# Patient Record
Sex: Female | Born: 1940 | Race: White | Hispanic: No | Smoking: Never smoker
Health system: Southern US, Community
[De-identification: ages and names within clinical notes are randomized; demographics above are authoritative.]

## PROBLEM LIST (undated history)

## (undated) DIAGNOSIS — Z9849 Cataract extraction status, unspecified eye: Secondary | ICD-10-CM

## (undated) DIAGNOSIS — G219 Secondary parkinsonism, unspecified: Secondary | ICD-10-CM

## (undated) DIAGNOSIS — M545 Low back pain, unspecified: Secondary | ICD-10-CM

## (undated) DIAGNOSIS — I10 Essential (primary) hypertension: Secondary | ICD-10-CM

## (undated) DIAGNOSIS — E039 Hypothyroidism, unspecified: Secondary | ICD-10-CM

## (undated) DIAGNOSIS — Z87440 Personal history of urinary (tract) infections: Secondary | ICD-10-CM

## (undated) DIAGNOSIS — G8929 Other chronic pain: Secondary | ICD-10-CM

## (undated) DIAGNOSIS — F410 Panic disorder [episodic paroxysmal anxiety] without agoraphobia: Secondary | ICD-10-CM

## (undated) DIAGNOSIS — E785 Hyperlipidemia, unspecified: Secondary | ICD-10-CM

## (undated) DIAGNOSIS — Z9089 Acquired absence of other organs: Secondary | ICD-10-CM

## (undated) DIAGNOSIS — Z9889 Other specified postprocedural states: Secondary | ICD-10-CM

## (undated) DIAGNOSIS — G2571 Drug induced akathisia: Secondary | ICD-10-CM

## (undated) DIAGNOSIS — R413 Other amnesia: Secondary | ICD-10-CM

## (undated) DIAGNOSIS — N189 Chronic kidney disease, unspecified: Secondary | ICD-10-CM

## (undated) DIAGNOSIS — IMO0002 Reserved for concepts with insufficient information to code with codable children: Secondary | ICD-10-CM

## (undated) DIAGNOSIS — F319 Bipolar disorder, unspecified: Secondary | ICD-10-CM

## (undated) DIAGNOSIS — R269 Unspecified abnormalities of gait and mobility: Secondary | ICD-10-CM

## (undated) HISTORY — DX: Hypothyroidism, unspecified: E03.9

## (undated) HISTORY — DX: Other chronic pain: G89.29

## (undated) HISTORY — DX: Panic disorder (episodic paroxysmal anxiety): F41.0

## (undated) HISTORY — DX: Other amnesia: R41.3

## (undated) HISTORY — DX: Chronic kidney disease, unspecified: N18.9

## (undated) HISTORY — PX: OTHER SURGICAL HISTORY: SHX169

## (undated) HISTORY — DX: Bipolar disorder, unspecified: F31.9

## (undated) HISTORY — PX: BUNIONECTOMY: SHX129

## (undated) HISTORY — DX: Secondary parkinsonism, unspecified: G21.9

## (undated) HISTORY — DX: Acquired absence of other organs: Z90.89

## (undated) HISTORY — PX: CATARACT EXTRACTION: SUR2

## (undated) HISTORY — DX: Reserved for concepts with insufficient information to code with codable children: IMO0002

## (undated) HISTORY — DX: Hyperlipidemia, unspecified: E78.5

## (undated) HISTORY — DX: Essential (primary) hypertension: I10

## (undated) HISTORY — DX: Personal history of urinary (tract) infections: Z87.440

## (undated) HISTORY — DX: Cataract extraction status, unspecified eye: Z98.49

## (undated) HISTORY — DX: Drug induced akathisia: G25.71

## (undated) HISTORY — DX: Low back pain, unspecified: M54.50

## (undated) HISTORY — PX: OOPHORECTOMY: SHX86

## (undated) HISTORY — DX: Unspecified abnormalities of gait and mobility: R26.9

## (undated) HISTORY — DX: Low back pain: M54.5

## (undated) HISTORY — PX: TONSILLECTOMY: SUR1361

## (undated) HISTORY — PX: CHOLECYSTECTOMY: SHX55

## (undated) HISTORY — DX: Other specified postprocedural states: Z98.890

---

## 2009-05-11 ENCOUNTER — Inpatient Hospital Stay (HOSPITAL_COMMUNITY): Admission: RE | Admit: 2009-05-11 | Discharge: 2009-05-15 | Payer: Self-pay | Admitting: Neurological Surgery

## 2009-06-13 ENCOUNTER — Encounter: Admission: RE | Admit: 2009-06-13 | Discharge: 2009-06-13 | Payer: Self-pay | Admitting: Neurological Surgery

## 2009-07-18 ENCOUNTER — Encounter: Admission: RE | Admit: 2009-07-18 | Discharge: 2009-07-18 | Payer: Self-pay | Admitting: Neurological Surgery

## 2009-07-19 ENCOUNTER — Encounter: Admission: RE | Admit: 2009-07-19 | Discharge: 2009-07-19 | Payer: Self-pay | Admitting: Neurological Surgery

## 2009-08-29 ENCOUNTER — Encounter: Admission: RE | Admit: 2009-08-29 | Discharge: 2009-08-29 | Payer: Self-pay | Admitting: Neurological Surgery

## 2009-08-30 ENCOUNTER — Encounter: Admission: RE | Admit: 2009-08-30 | Discharge: 2009-08-30 | Payer: Self-pay | Admitting: Neurological Surgery

## 2009-09-01 ENCOUNTER — Encounter: Admission: RE | Admit: 2009-09-01 | Discharge: 2009-11-30 | Payer: Self-pay | Admitting: Neurological Surgery

## 2009-10-28 ENCOUNTER — Encounter: Admission: RE | Admit: 2009-10-28 | Discharge: 2009-10-28 | Payer: Self-pay | Admitting: Neurology

## 2009-12-02 ENCOUNTER — Encounter: Admission: RE | Admit: 2009-12-02 | Discharge: 2009-12-02 | Payer: Self-pay | Admitting: Neurological Surgery

## 2010-03-20 ENCOUNTER — Encounter: Admission: RE | Admit: 2010-03-20 | Discharge: 2010-03-20 | Payer: Self-pay | Admitting: Neurological Surgery

## 2010-09-02 ENCOUNTER — Encounter: Payer: Self-pay | Admitting: Neurological Surgery

## 2010-11-16 LAB — APTT: aPTT: 28 seconds (ref 24–37)

## 2010-11-16 LAB — GLUCOSE, CAPILLARY

## 2010-11-16 LAB — BASIC METABOLIC PANEL
Chloride: 100 mEq/L (ref 96–112)
Creatinine, Ser: 1.52 mg/dL — ABNORMAL HIGH (ref 0.4–1.2)
Potassium: 4.3 mEq/L (ref 3.5–5.1)
Sodium: 137 mEq/L (ref 135–145)

## 2010-11-16 LAB — DIFFERENTIAL
Basophils Absolute: 0 10*3/uL (ref 0.0–0.1)
Basophils Relative: 0 % (ref 0–1)
Eosinophils Absolute: 0.1 10*3/uL (ref 0.0–0.7)
Lymphocytes Relative: 10 % — ABNORMAL LOW (ref 12–46)
Lymphs Abs: 1.1 10*3/uL (ref 0.7–4.0)
Monocytes Absolute: 0.4 10*3/uL (ref 0.1–1.0)
Monocytes Relative: 4 % (ref 3–12)
Neutro Abs: 9.3 10*3/uL — ABNORMAL HIGH (ref 1.7–7.7)

## 2010-11-16 LAB — NO BLOOD PRODUCTS

## 2010-11-16 LAB — PROTIME-INR: Prothrombin Time: 12.6 seconds (ref 11.6–15.2)

## 2010-11-16 LAB — CBC
HCT: 35.7 % — ABNORMAL LOW (ref 36.0–46.0)
MCV: 93.6 fL (ref 78.0–100.0)
Platelets: 312 10*3/uL (ref 150–400)
RBC: 3.82 MIL/uL — ABNORMAL LOW (ref 3.87–5.11)
WBC: 11 10*3/uL — ABNORMAL HIGH (ref 4.0–10.5)

## 2011-03-18 IMAGING — RF DG LUMBAR SPINE 2-3V
1 series · 2 of 2 positions shown · non-contrast
Comparison: None

CLINICAL DATA: Posterior fusion.

LUMBAR SPINE - 2-3 VIEW

[Series 1: run · 2 of 2 slices shown]
[im 1/2]
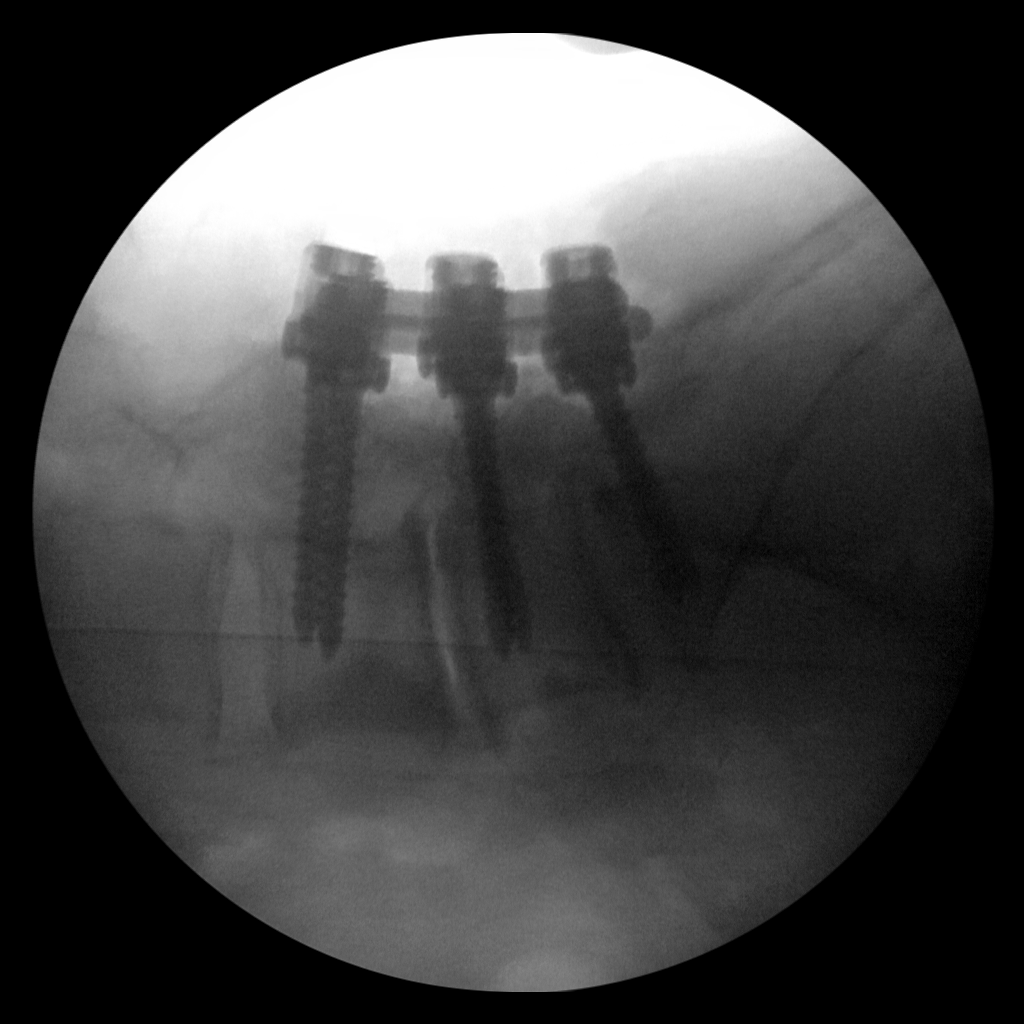
[im 2/2]
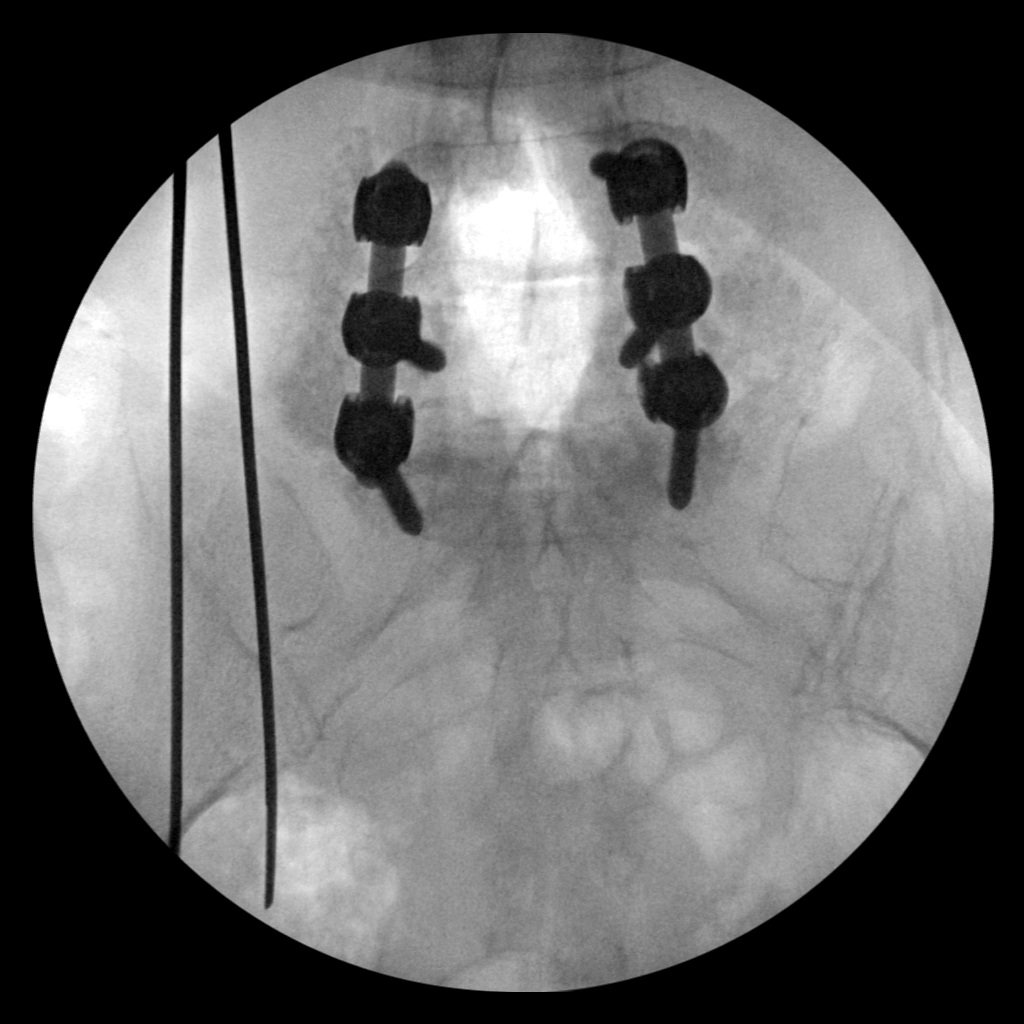

[2 of 2 positions shown; findings below may reference images not displayed]

FINDINGS: Two intraoperative spot images demonstrate changes of
posterior fusion from L4-S1.  Normal alignment.
IMPRESSION: L4-S1 posterior fusion.

## 2011-07-06 IMAGING — CR DG LUMBAR SPINE COMPLETE 4+V
4 series · 4 of 4 positions shown · non-contrast
Comparison: [HOSPITAL] at [REDACTED] [HOSPITAL] lumbar spine
radiographs 07/18/2009 and [HOSPITAL] [HOSPITAL] lumbar
spine CT 07/19/2009.

CLINICAL DATA: LUMBAR SPINE - COMPLETE 4+ VIEW

[w l-spine lat (1 of 3)]
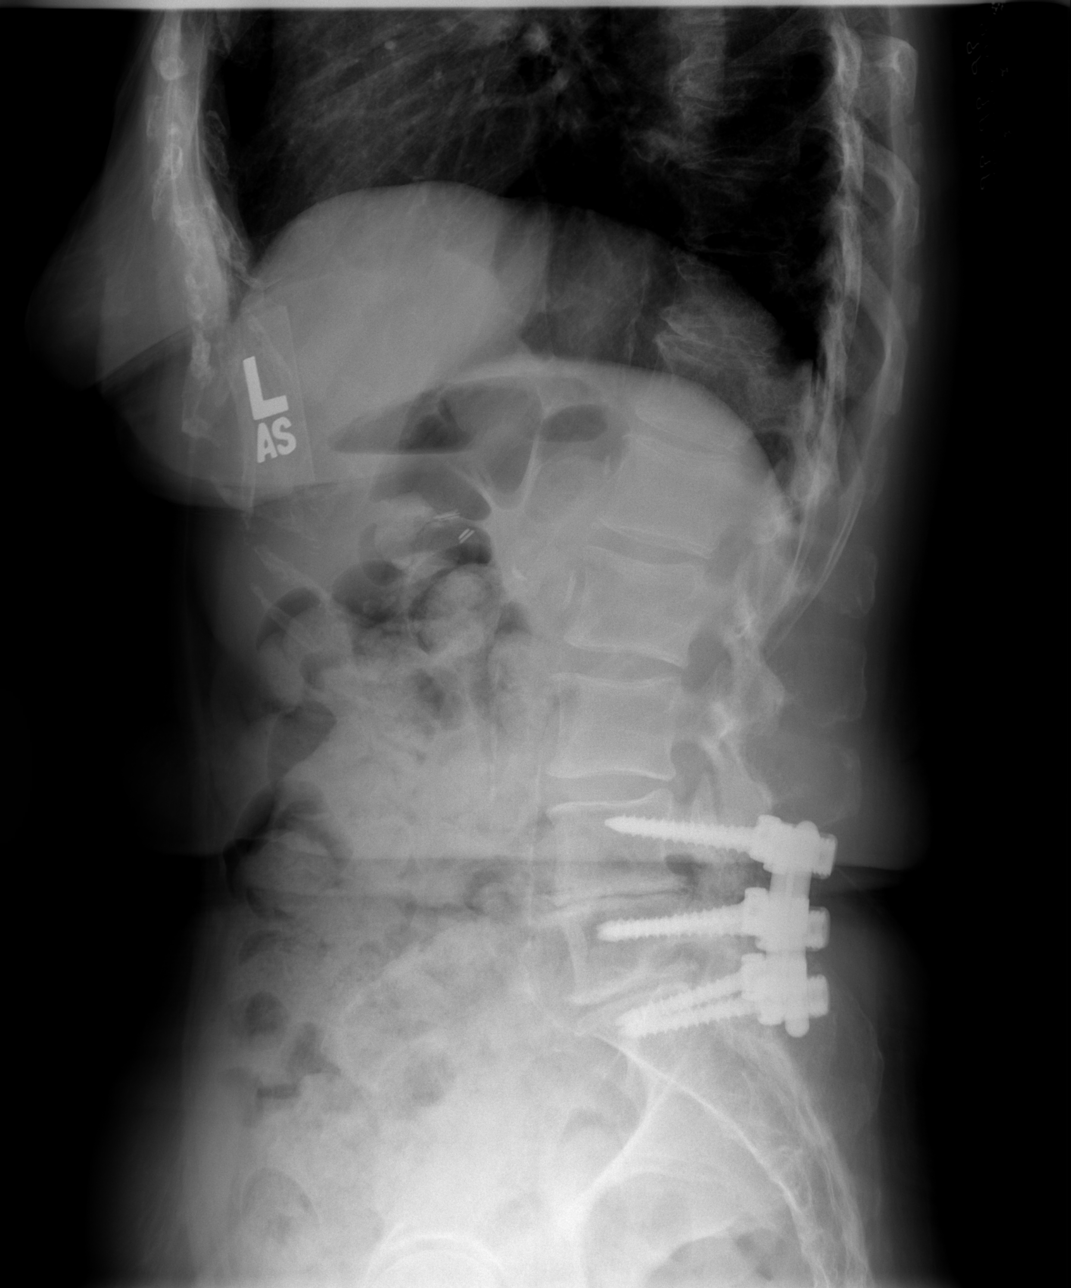

[w l-spine lat (2 of 3)]
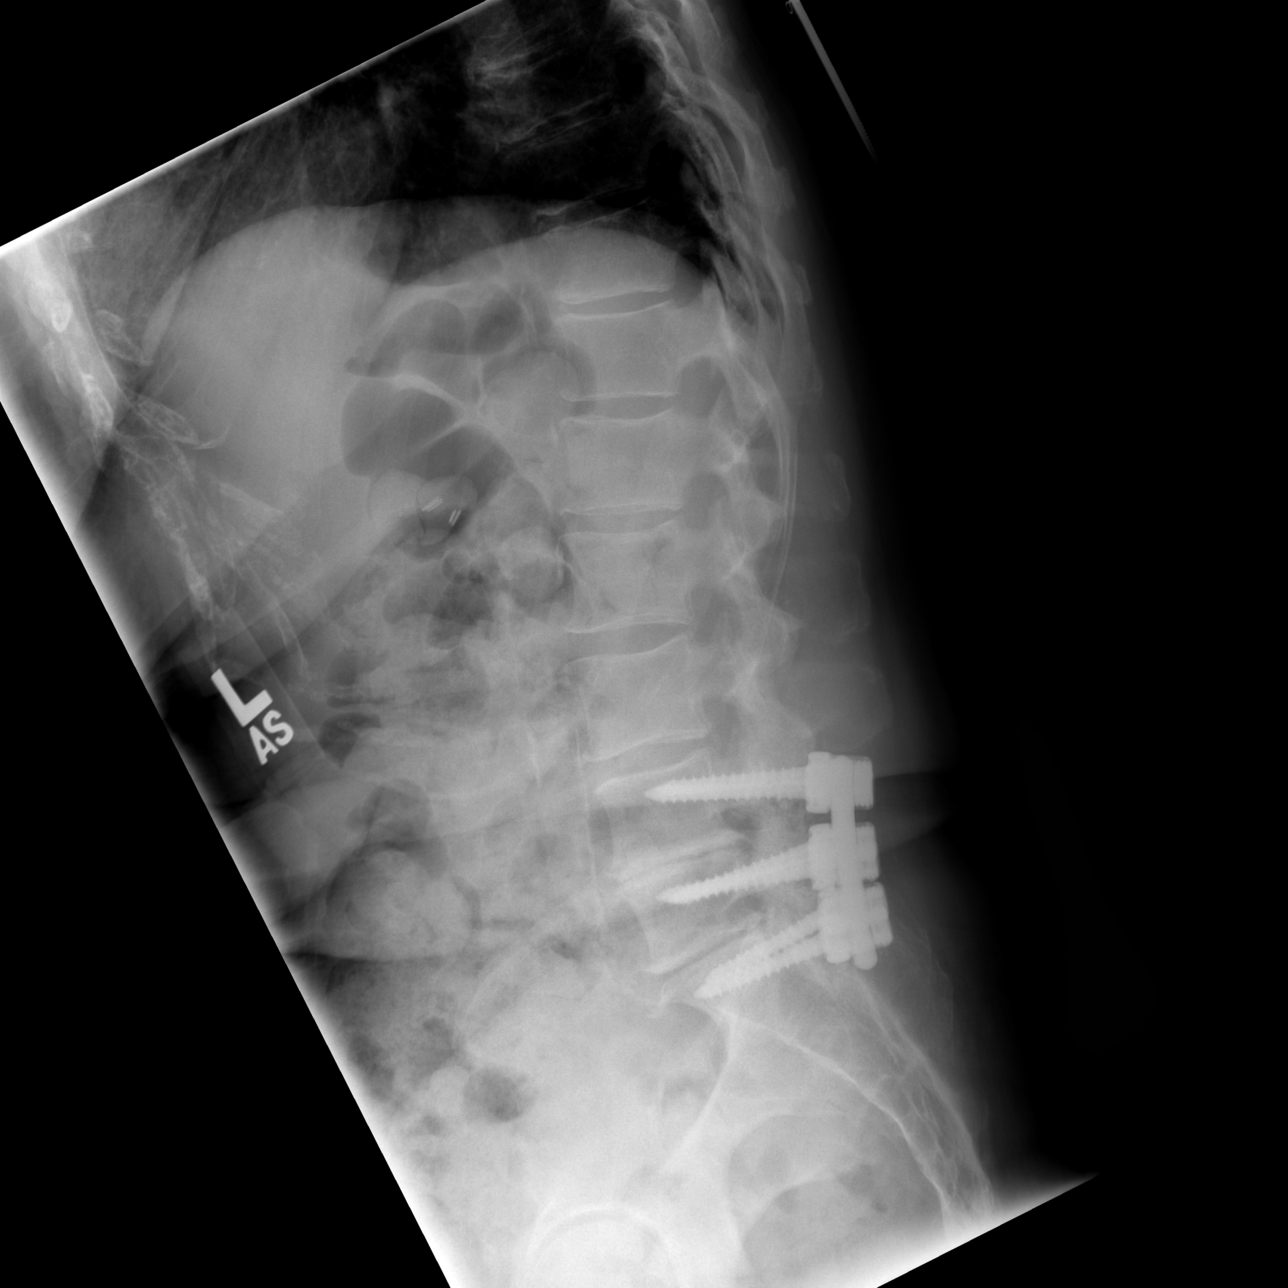

[w l-spine lat (3 of 3)]
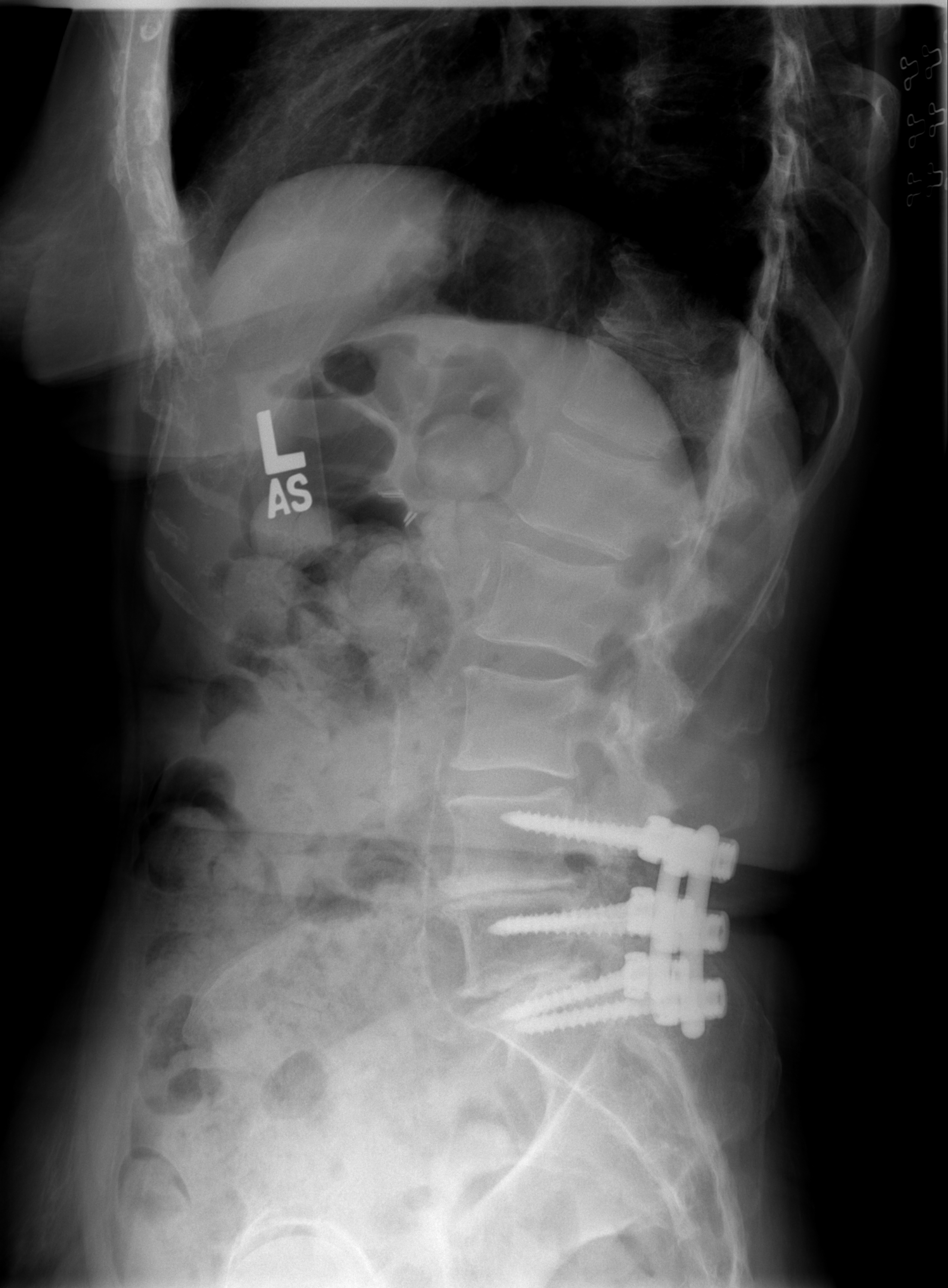

[w l-spine a.p.]
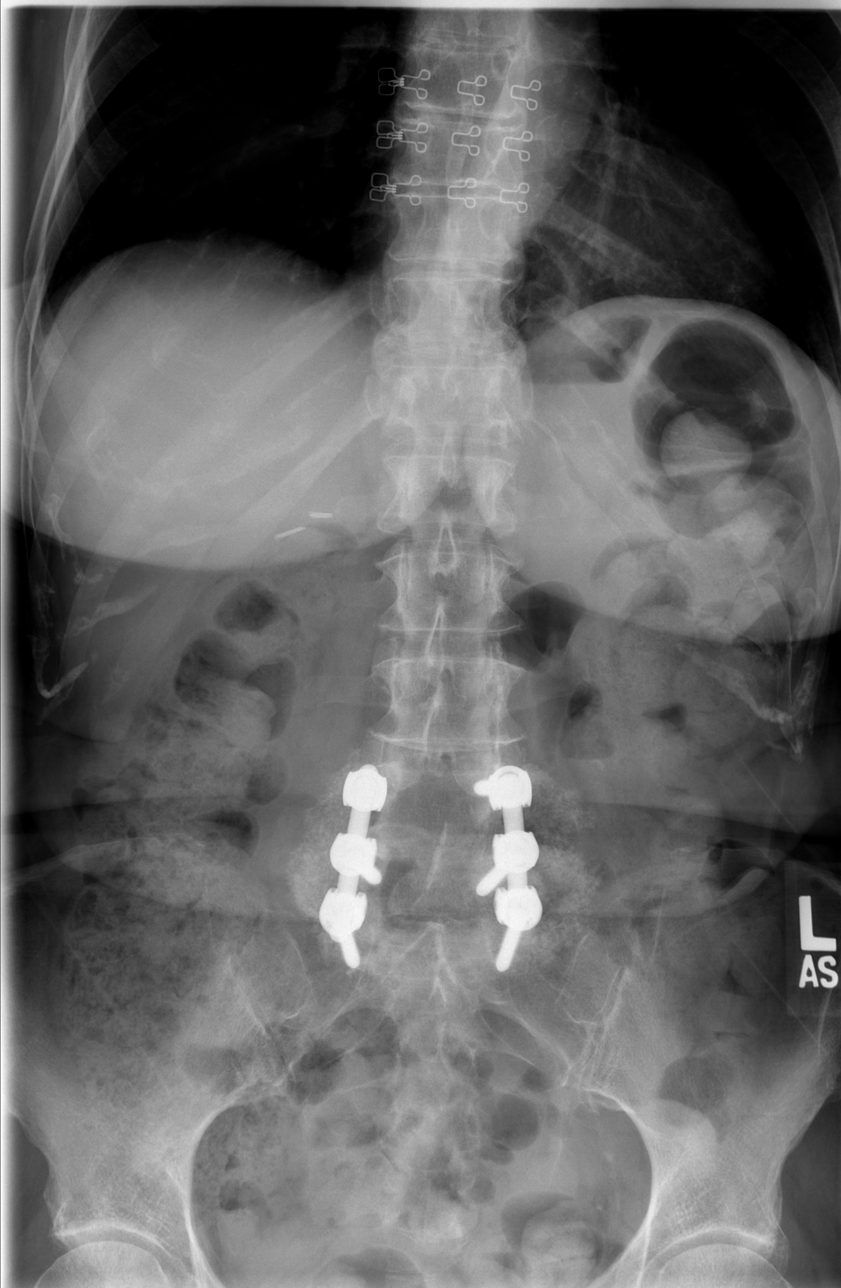

[4 of 4 positions shown; findings below may reference images not displayed]

FINDINGS: Again seen five non-rib bearing lumbar vertebrae with
stable posterior laminectomy hardware and osseous fusion L4-S1.
Stable advanced degenerative disc disease L4-5 and L5-S1 noted.
The posterior vertebral alignment is normally maintained with
stable slight posterior spondylosis L4-5 and L5-S1 on neutral and
flexion/extension lateral views.  No change in extensive
atheromatous vascular calcification with normal caliber abdominal
aorta.
IMPRESSION: 1.  No interval acute findings.
2.  Normal posterior vertebral alignment on the lateral neutral and
flexion/extension views.
3.  Stable satisfactory posterior laminectomy fusion L4-S1 with
advanced degenerative disc disease L4-5 and L5-S1 with slight
posterior spondylosis at these levels.

## 2011-07-07 IMAGING — CR DG PELVIS 1-2V
2 series · 2 of 2 positions shown · non-contrast
Comparison: Bilateral hip films 07/18/2009.

CLINICAL DATA: Pelvic pain.

PELVIS - 1-2 VIEW

[view not recorded (1 of 2)]
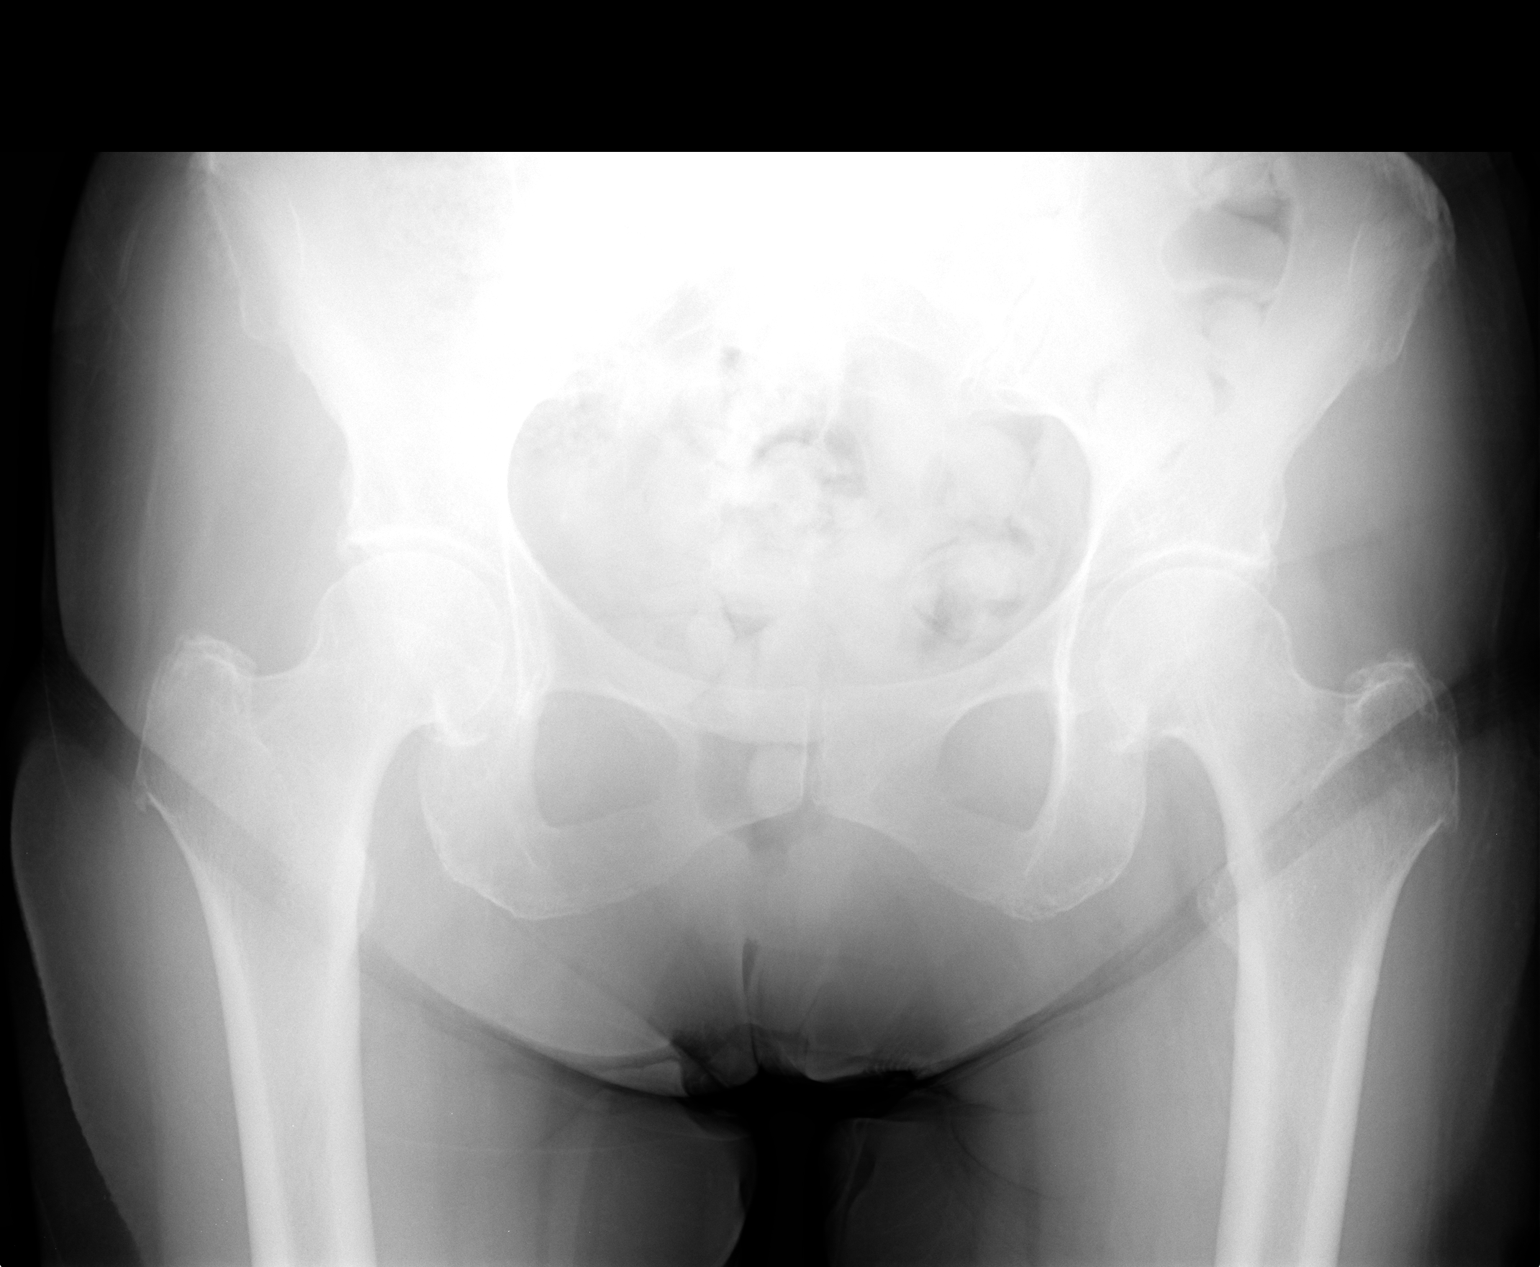

[view not recorded (2 of 2)]
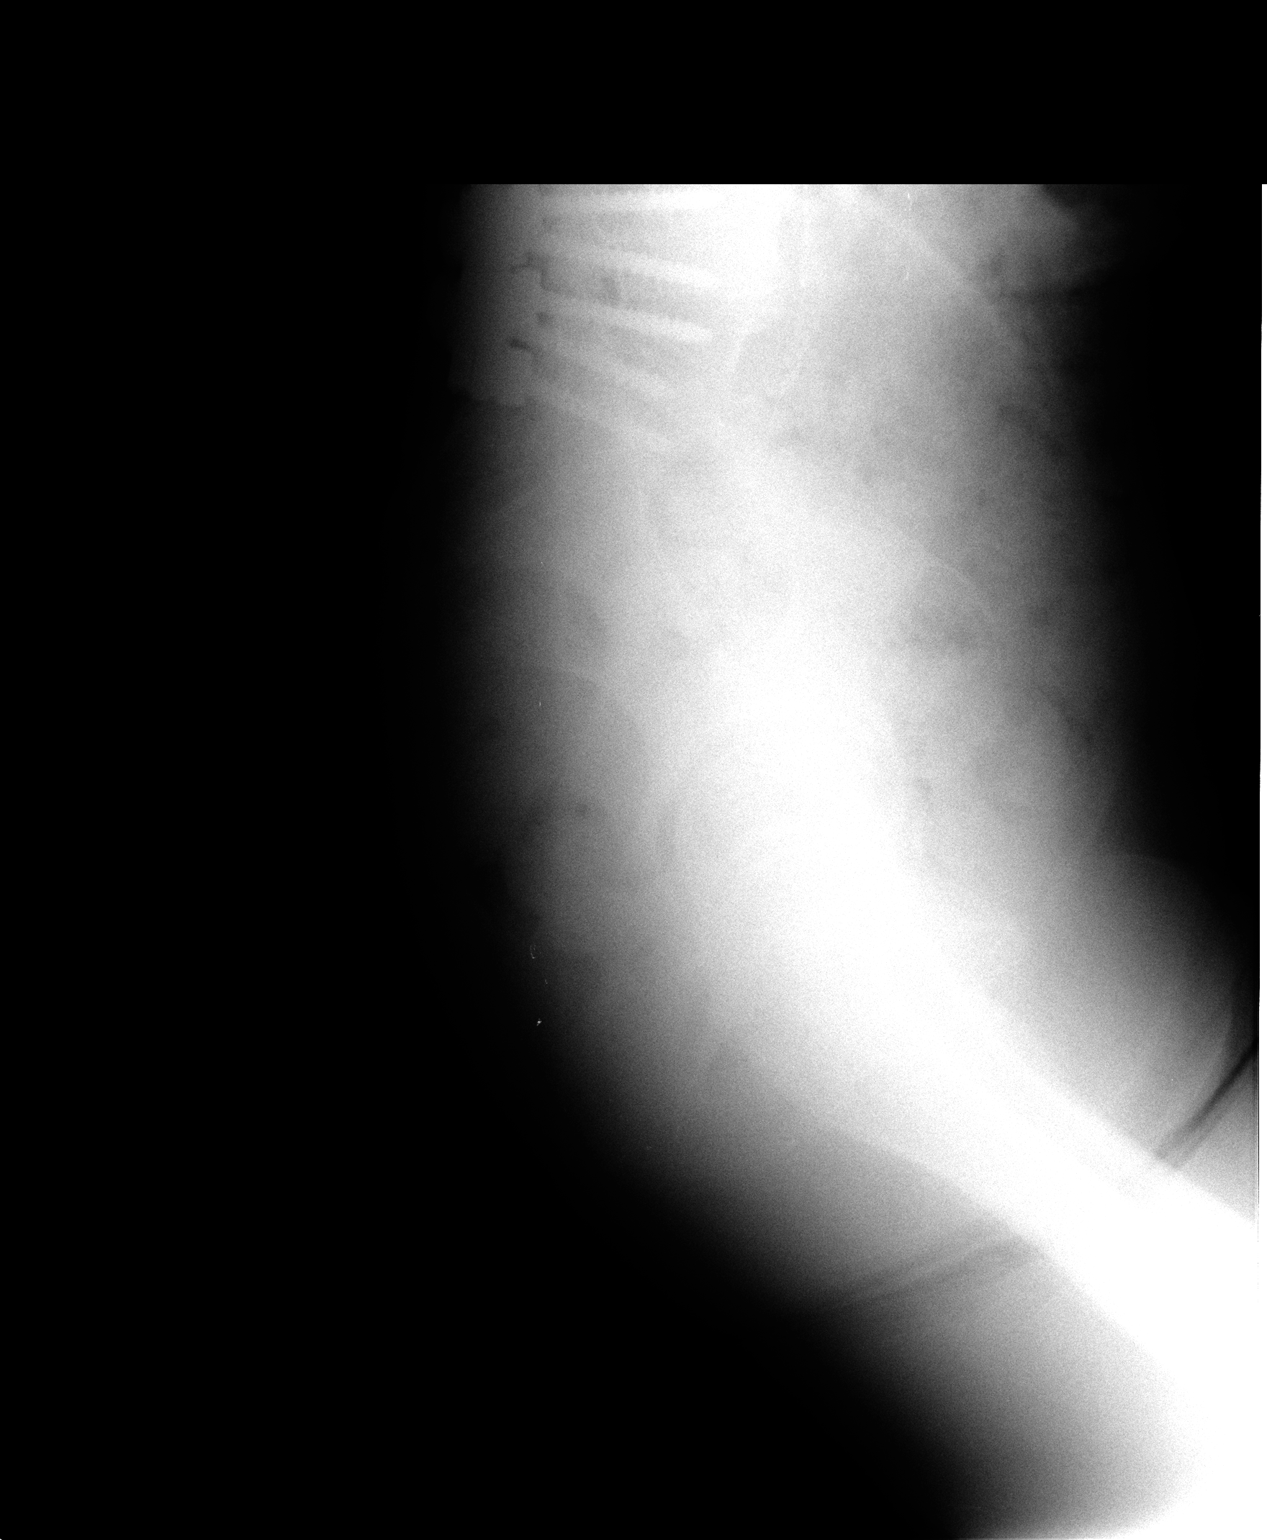

[2 of 2 positions shown; findings below may reference images not displayed]

FINDINGS: The hips are located and the pelvis is intact.  The
patient has some degenerative disease about the hips which appears
fairly mild and more notable on the right.
IMPRESSION: No change in mild to moderate bilateral hip degenerative disease,
worse on the right.  No acute abnormality.

## 2013-04-21 ENCOUNTER — Other Ambulatory Visit: Payer: Self-pay | Admitting: Neurology

## 2013-04-22 ENCOUNTER — Other Ambulatory Visit: Payer: Self-pay | Admitting: Neurology

## 2013-04-26 ENCOUNTER — Other Ambulatory Visit: Payer: Self-pay | Admitting: Neurology

## 2013-05-22 ENCOUNTER — Other Ambulatory Visit: Payer: Self-pay | Admitting: Neurology

## 2013-06-10 ENCOUNTER — Encounter: Payer: Self-pay | Admitting: Nurse Practitioner

## 2013-06-15 ENCOUNTER — Encounter: Payer: Self-pay | Admitting: Nurse Practitioner

## 2013-06-15 ENCOUNTER — Telehealth: Payer: Self-pay | Admitting: Nurse Practitioner

## 2013-06-15 ENCOUNTER — Encounter (INDEPENDENT_AMBULATORY_CARE_PROVIDER_SITE_OTHER): Payer: Self-pay

## 2013-06-15 ENCOUNTER — Ambulatory Visit (INDEPENDENT_AMBULATORY_CARE_PROVIDER_SITE_OTHER): Payer: Medicare Other | Admitting: Nurse Practitioner

## 2013-06-15 VITALS — BP 112/65 | HR 53 | Ht 63.5 in | Wt 174.0 lb

## 2013-06-15 DIAGNOSIS — R269 Unspecified abnormalities of gait and mobility: Secondary | ICD-10-CM | POA: Insufficient documentation

## 2013-06-15 DIAGNOSIS — G219 Secondary parkinsonism, unspecified: Secondary | ICD-10-CM | POA: Insufficient documentation

## 2013-06-15 DIAGNOSIS — R413 Other amnesia: Secondary | ICD-10-CM | POA: Insufficient documentation

## 2013-06-15 MED ORDER — DONEPEZIL HCL 10 MG PO TABS: 10.0000 mg | ORAL_TABLET | Freq: Every evening | ORAL | Status: AC

## 2013-06-15 NOTE — Telephone Encounter (Signed)
Called patient to remind her of an appt 06/15/13 @ 1:30 with Darrol Angel couldn't be, reached Max no answer.

## 2013-06-15 NOTE — Progress Notes (Signed)
[facility]  PATIENT: Jenna Brooks DOB: Sep 08, 1940   REASON FOR VISIT: Followup for memory loss   HISTORY OF PRESENT ILLNESS:Jenna Brooks, 72 year old right-handed white female with a history of secondary parkinsonism, and a history of a memory disorder returns for followup.  The patient has been placed on full dose Aricept, and she is tolerating the medication well. The patient has had some mild progression of her memory issues, but has not given up any activities of daily living secondary to memory dysfunction. The patient continues to have ongoing low back issues, and is on narcotic medication for this.  Recently diagnosed with diabetes. The patient indicates that when she is sitting or lying down, the low back pain improves. The patient lives with her daughter who is with her today.    REVIEW OF SYSTEMS: Full 14 system review of systems performed and notable only for:  Constitutional: N/A  Cardiovascular: N/A  Ear/Nose/Throat: N/A  Skin: N/A  Eyes: N/A  Respiratory: N/A  Gastroitestinal: Constipation  Hematology/Lymphatic: N/A  Endocrine: N/A Musculoskeletal: Joint pain  Allergy/Immunology: N/A  Neurological: Memory loss Psychiatric: Bipolar disorder  ALLERGIES: Allergies  Allergen Reactions  . Penicillins     HOME MEDICATIONS: Outpatient Prescriptions Prior to Visit  Medication Sig Dispense Refill  . Biotin 1000 MCG tablet Take 1,000 mcg by mouth 3 (three) times daily.      . cholecalciferol (VITAMIN D) 1000 UNITS tablet Take 1,000 Units by mouth daily.      Marland Kitchen donepezil (ARICEPT) 10 MG tablet Take 1 tablet (10 mg total) by mouth every evening.  30 tablet  0  . levothyroxine (SYNTHROID, LEVOTHROID) 25 MCG tablet Take 25 mcg by mouth daily before breakfast.      . metoprolol tartrate (LOPRESSOR) 25 MG tablet TAKE 1 TABLET BY MOUTH TWICE DAILY  60 tablet  0  . mirtazapine (REMERON) 15 MG tablet Take 15 mg by mouth at bedtime.      . Multiple Vitamin  (MULTIVITAMIN) capsule Take 1 capsule by mouth daily.      . Omega-3 Fatty Acids (FISH OIL) 1200 MG CAPS Take 1,200 mg by mouth 2 (two) times daily.      . risperiDONE (RISPERDAL) 0.5 MG tablet Take 0.5 mg by mouth 2 (two) times daily.      . simvastatin (ZOCOR) 40 MG tablet Take 40 mg by mouth every evening.      . vitamin C (ASCORBIC ACID) 500 MG tablet Take 500 mg by mouth daily.      Marland Kitchen HYDROcodone-acetaminophen (NORCO) 7.5-325 MG per tablet Take 1 tablet by mouth every 6 (six) hours as needed for pain.       No facility-administered medications prior to visit.    PAST MEDICAL HISTORY: Past Medical History  Diagnosis Date  . Memory disturbance   . H/O lumbosacral spine surgery     L4-5 and L5-S1 levels  . History of cataract surgery     bilaterally  . Hypertension   . H/O oophorectomy   . Hypothyroidism   . Hx of tonsillectomy   . Akathisia   . Secondary parkinsonism   . Bipolar disorder   . History of frequent urinary tract infections   . Chronic renal insufficiency   . Panic disorder   . Gait disorder   . Dyslipidemia   . Chronic low back pain     PAST SURGICAL HISTORY: History reviewed. No pertinent past surgical history.  FAMILY HISTORY: Family History  Problem Relation  Age of Onset  . Heart failure Father   . Diabetes Mother   . Dementia Mother   . Stroke Mother   . Diabetes Brother     All 3 brothers had  . Diabetes Sister   . Dementia Sister     SOCIAL HISTORY: History   Social History  . Marital Status: Widowed    Spouse Name: N/A    Number of Children: 1  . Years of Education: college   Occupational History  . Not on file.   Social History Main Topics  . Smoking status: Never Smoker   . Smokeless tobacco: Never Used  . Alcohol Use: No  . Drug Use: No  . Sexual Activity: Not on file   Other Topics Concern  . Not on file   Social History Narrative   Patient is widowed and lives with her daughter.   Patient is retired.   Patient has  one child.   Patient has a college education.   Patient is right-handed.   Patient does not drink caffeine.     PHYSICAL EXAM  Filed Vitals:   06/15/13 1308  BP: 112/65  Pulse: 53  Height: 5' 3.5" (1.613 m)  Weight: 174 lb (78.926 kg)   Body mass index is 30.34 kg/(m^2).  Generalized: Well developed, moderately obese female in no acute distress  Head: normocephalic and atraumatic,. Oropharynx benign  Neck: Supple, no carotid bruits  Cardiac: Regular rate rhythm, 2/6 systolic ejection murmur  Musculoskeletal: Leans to be left when  standing  Neurological examination   Mentation: Alert oriented to time, place, history taking. Follows all commands speech and language fluent  Cranial nerve II-XII: Pupils were equal round reactive to light extraocular movements were full, visual field were full on confrontational test. Facial sensation and strength were normal. hearing was intact to finger rubbing bilaterally. Uvula tongue midline. head turning and shoulder shrug and were normal and symmetric.Tongue protrusion into cheek strength was normal. Motor: normal bulk and tone, full strength in the BUE, BLE, fine finger movements normal, no pronator drift. No focal weakness. Restless movements of the legs are noted on seated Coordination: finger-nose-finger, heel-to-shin bilaterally, no dysmetria Reflexes: Brachioradialis 2/2, biceps 2/2, triceps 2/2, patellar 2/2, Achilles 2/2, plantar responses were flexor bilaterally. Gait and Station: Rising up from seated position without assistance, normal stance,  moderate stride,  smooth turning, no assistive device Tandem gait unsteady   DIAGNOSTIC DATA (LABS, IMAGING, TESTING) - None to review   ASSESSMENT AND PLAN  72 y.o. year old female  has a past medical history of Memory disturbance; H/O lumbosacral spine surgery;  Hypertension; Hypothyroidism; ; Akathisia; Secondary parkinsonism; Bipolar disorder; History of frequent urinary tract  infections; Chronic renal insufficiency; Panic disorder; Gait disorder; Dyslipidemia; and Chronic low back pain. here to followup for memory. Remains on Aricept 10 mg without side effects  Continue Aricept 10 mg daily, will refill Mini-Mental Status exam 28/30 Followup in 6 months Nilda Riggs, [facility], [facility], APRN  [facility] [address], [address] [place], [place] [address] [phone]

## 2013-06-15 NOTE — Patient Instructions (Signed)
Continue Aricept 10 mg daily Mini-Mental Status exam is stable Followup in 6 months

## 2013-06-16 NOTE — Progress Notes (Signed)
I have read the note, and I agree with the clinical assessment and plan.  Hafsa Lohn KEITH   

## 2013-08-17 ENCOUNTER — Other Ambulatory Visit: Payer: Self-pay | Admitting: Neurology

## 2013-11-02 ENCOUNTER — Telehealth: Payer: Self-pay | Admitting: Neurology

## 2013-11-02 NOTE — Telephone Encounter (Signed)
Patient's daughter wants to discuss her mother's leg shaking and to see if any of her medications could be causing theses symptoms.   Please advise.

## 2013-11-02 NOTE — Telephone Encounter (Signed)
Patient's daughter calling to state that she is unsure whether patient needs to be seen sooner, patient's daughter states that patient's legs are shaking a lot more. Patient's daughter is unsure if it is due to her medication interacting with her other meds. Please call and advise daughter.

## 2013-11-02 NOTE — Telephone Encounter (Signed)
I called the patient. The patient is having some problems with tremulousness of the legs when she is up walking, and while sitting. This has been described as restless leg syndrome, the patient is on Risperdal which can cause this. The patient is having symptoms while walking, and I am not sure that this truly is restless leg syndrome. I'll try to get the patient in for a revisit to figure out what is going on. The patient was placed on ropinirole, but this did not help.

## 2013-11-04 ENCOUNTER — Telehealth: Payer: Self-pay | Admitting: *Deleted

## 2013-11-04 NOTE — Telephone Encounter (Signed)
1st available work-in appt is 11/22/13 @ 12 noon. Left msg

## 2013-11-09 NOTE — Telephone Encounter (Signed)
appt has been scheduled for 11/26/13.

## 2013-11-26 ENCOUNTER — Ambulatory Visit (INDEPENDENT_AMBULATORY_CARE_PROVIDER_SITE_OTHER): Payer: Medicare Other | Admitting: Neurology

## 2013-11-26 ENCOUNTER — Encounter (INDEPENDENT_AMBULATORY_CARE_PROVIDER_SITE_OTHER): Payer: Self-pay

## 2013-11-26 ENCOUNTER — Encounter: Payer: Self-pay | Admitting: Neurology

## 2013-11-26 VITALS — BP 108/60 | HR 62 | Wt 163.0 lb

## 2013-11-26 DIAGNOSIS — G2571 Drug induced akathisia: Secondary | ICD-10-CM | POA: Insufficient documentation

## 2013-11-26 DIAGNOSIS — R413 Other amnesia: Secondary | ICD-10-CM

## 2013-11-26 DIAGNOSIS — R259 Unspecified abnormal involuntary movements: Secondary | ICD-10-CM

## 2013-11-26 DIAGNOSIS — G219 Secondary parkinsonism, unspecified: Secondary | ICD-10-CM

## 2013-11-26 DIAGNOSIS — R269 Unspecified abnormalities of gait and mobility: Secondary | ICD-10-CM

## 2013-11-26 MED ORDER — CLONAZEPAM 0.5 MG PO TABS: ORAL_TABLET | ORAL | Status: DC

## 2013-11-26 NOTE — Progress Notes (Signed)
Reason for visit: Memory disorder  Jenna Brooks is an 73 y.o. female  History of present illness:  Ms. Jenna MinerHuffman is a 73 year old right-handed white female with a history of a mild memory disturbance. The patient has been on Risperdal for a number of years, and she was felt to have secondary parkinsonism at one point. The patient has evolved at this point to a syndrome of akathisia. The patient indicates that since November of 2014, her akathisia has significantly worsened. The patient indicates that she is constantly moving her legs, and the movement problems have affected her breathing. The patient also has tardive dyskinesia. The patient is on Aricept at this point. The patient also takes mirtazapine at nighttime for sleep. The patient returns to this office for an evaluation. The patient feels that the abnormal movements do affect her walking, but she has not had any falls.  Past Medical History  Diagnosis Date  . Memory disturbance   . H/O lumbosacral spine surgery     L4-5 and L5-S1 levels  . History of cataract surgery     bilaterally  . Hypertension   . H/O oophorectomy   . Hypothyroidism   . Hx of tonsillectomy   . Akathisia   . Secondary parkinsonism   . Bipolar disorder   . History of frequent urinary tract infections   . Chronic renal insufficiency   . Panic disorder   . Gait disorder   . Dyslipidemia   . Chronic low back pain     Past Surgical History  Procedure Laterality Date  . Lumbosacral laminectomy      L4-5, L5-S1 level  . Oophorectomy    . Cholecystectomy    . Tonsillectomy    . Bunionectomy    . Cataract extraction Bilateral     Family History  Problem Relation Age of Onset  . Heart failure Father   . Diabetes Mother   . Dementia Mother   . Stroke Mother   . Diabetes Brother     All 3 brothers had  . Diabetes Sister   . Dementia Sister     Social history:  reports that she has never smoked. She has never used smokeless tobacco. She  reports that she does not drink alcohol or use illicit drugs.    Allergies  Allergen Reactions  . Penicillins     Medications:  Current Outpatient Prescriptions on File Prior to Visit  Medication Sig Dispense Refill  . Biotin 1000 MCG tablet Take 1,000 mcg by mouth 3 (three) times daily.      . cholecalciferol (VITAMIN D) 1000 UNITS tablet Take 1,000 Units by mouth daily.      Jenna Brooks. donepezil (ARICEPT) 10 MG tablet Take 1 tablet (10 mg total) by mouth every evening.  30 tablet  6  . levothyroxine (SYNTHROID, LEVOTHROID) 25 MCG tablet Take 25 mcg by mouth daily before breakfast.      . lisinopril (PRINIVIL,ZESTRIL) 10 MG tablet Take 10 mg by mouth daily.      . metFORMIN (GLUCOPHAGE) 500 MG tablet Take 500 mg by mouth daily.      . metoprolol tartrate (LOPRESSOR) 25 MG tablet Take 1 tablet (25 mg total) by mouth 2 (two) times daily.  60 tablet  4  . mirtazapine (REMERON) 15 MG tablet Take 15 mg by mouth at bedtime.      . Multiple Vitamin (MULTIVITAMIN) capsule Take 1 capsule by mouth daily.      . Omega-3 Fatty Acids (FISH OIL)  1200 MG CAPS Take 1,200 mg by mouth 2 (two) times daily.      . polyethylene glycol (MIRALAX / GLYCOLAX) packet Take 17 g by mouth daily.      . risperiDONE (RISPERDAL) 0.5 MG tablet Take 0.5 mg by mouth 2 (two) times daily.      . vitamin C (ASCORBIC ACID) 500 MG tablet Take 500 mg by mouth daily.       No current facility-administered medications on file prior to visit.    ROS:  Out of a complete 14 system review of symptoms, the patient complains only of the following symptoms, and all other reviewed systems are negative.  Trouble swallowing Eye Itching Heat intolerance Diarrhea Restless legs, frequent waking Frequency of urination, urgency of the bladder Back pain, walking difficulties Moles Memory loss  Blood pressure 108/60, pulse 62, weight 163 lb (73.936 kg).  Physical Exam  General: The patient is alert and cooperative at the time of the  examination.  Skin: No significant peripheral edema is noted.   Neurologic Exam  Mental status: The patient is oriented x 3.  Cranial nerves: Facial symmetry is present. Speech is normal, no aphasia or dysarthria is noted. Extraocular movements are full. Visual fields are full. Constant movements of the jaw and tongue are noted.  Motor: The patient has good strength in all 4 extremities.  Sensory examination: Soft touch sensation is symmetric on the face, arms, and legs.   Coordination: The patient has good finger-nose-finger and heel-to-shin bilaterally.The patient is constant fidgety movements of the legs, with unusual irregular breathing pattern.  Gait and station: The patient has a  stooped posture with walking. The patient appears to have some slight gait instability, no falls, can walk independently. Tandem gait is slightly unsteady.  Romberg is negative.With standing still, the leg movements are seen.  No drift is seen.  Reflexes: Deep tendon reflexes are symmetric.   Assessment/Plan:  1. Akathisia  2. Tardive dyskinesia  3. Mild memory disturbance  The patient is on Risperdal, and she has been on this for number of years. Initially, she had a secondary parkinsonism syndrome, but this has evolved into akathisia. The movements have steadily worsened over time, and the patient is already on a beta blocker. The patient will be placed on clonazepam. If this is not effective for treating her movement disorder, the mirtazapine will need to be discontinued, and Aricept will need to be reduced in dose. The patient will followup otherwise in 3-4 months.  Marlan Palau. Jenna Perfecto Purdy MD 11/26/2013 6:16 PM  [facility] [address] [place], [place] [id number]  Phone [phone] Fax [phone]

## 2013-11-26 NOTE — Patient Instructions (Signed)

## 2013-12-13 ENCOUNTER — Telehealth: Payer: Self-pay | Admitting: *Deleted

## 2013-12-13 NOTE — Telephone Encounter (Signed)
Patients daughter calling to inform Dr. Anne HahnWillis that medication clonazePAM (KLONOPIN) 0.5 MG tablet wasn't working.  Questioning if mg should be increased.

## 2013-12-13 NOTE — Telephone Encounter (Signed)
I called the daughter. The patient is tolerating the medication fairly well, but she has had some recent issues with diarrhea, but this is not a listed common side effect of clonazepam. She will try going up to 1 mg twice daily, if this is helpful, we will continue at this dose.

## 2013-12-13 NOTE — Telephone Encounter (Signed)
I called the daughter, left a message. The clonazepam could be increased if she is tolerating it. I will call back later.

## 2013-12-14 ENCOUNTER — Ambulatory Visit: Payer: Medicare Other | Admitting: Nurse Practitioner

## 2013-12-16 ENCOUNTER — Telehealth: Payer: Self-pay | Admitting: *Deleted

## 2013-12-16 NOTE — Telephone Encounter (Signed)
Rx for Klonopin was written at [facility] on 04/17 for 4 month supply.  I called back.  Got no answer.  Left message.

## 2013-12-16 NOTE — Telephone Encounter (Signed)
Christy(dtr) requesting rx for Clonazepam 1 mg.  P;ease call daughter.

## 2013-12-27 ENCOUNTER — Telehealth: Payer: Self-pay | Admitting: Neurology

## 2013-12-27 NOTE — Telephone Encounter (Signed)
Patient's daughter needing 1 mg of Klonopin per Dr Anne HahnWillis instructions.  Please send to [facility] in [place].

## 2013-12-28 ENCOUNTER — Other Ambulatory Visit: Payer: Self-pay

## 2013-12-28 MED ORDER — CLONAZEPAM 0.5 MG PO TABS: 1.0000 mg | ORAL_TABLET | Freq: Two times a day (BID) | ORAL | Status: AC

## 2013-12-28 NOTE — Telephone Encounter (Signed)
Rx signed and faxed.

## 2014-03-28 ENCOUNTER — Telehealth: Payer: Self-pay | Admitting: *Deleted

## 2014-03-28 ENCOUNTER — Encounter: Payer: Self-pay | Admitting: *Deleted

## 2014-03-28 NOTE — Telephone Encounter (Signed)
Patient needs to be rescheduled. Phone is no longer in service

## 2014-04-07 ENCOUNTER — Ambulatory Visit: Payer: Medicare Other | Admitting: Nurse Practitioner

## 2014-04-07 ENCOUNTER — Ambulatory Visit: Payer: Self-pay | Admitting: Adult Health

## 2014-05-16 ENCOUNTER — Other Ambulatory Visit: Payer: Self-pay | Admitting: Neurology

## 2014-06-29 ENCOUNTER — Encounter: Payer: Self-pay | Admitting: Neurology

## 2014-07-05 ENCOUNTER — Encounter: Payer: Self-pay | Admitting: Neurology

## 2023-03-13 DEATH — deceased
